# Patient Record
Sex: Female | Born: 1985 | Race: White | Hispanic: No | Marital: Single | Smoking: Never smoker
Health system: Southern US, Community
[De-identification: ages and names within clinical notes are randomized; demographics above are authoritative.]

## PROBLEM LIST (undated history)

## (undated) DIAGNOSIS — S92253A Displaced fracture of navicular [scaphoid] of unspecified foot, initial encounter for closed fracture: Secondary | ICD-10-CM

## (undated) HISTORY — PX: WISDOM TOOTH EXTRACTION: SHX21

---

## 2008-08-29 ENCOUNTER — Emergency Department (HOSPITAL_COMMUNITY): Admission: EM | Admit: 2008-08-29 | Discharge: 2008-08-29 | Payer: Self-pay | Admitting: Emergency Medicine

## 2008-08-31 ENCOUNTER — Ambulatory Visit: Payer: Self-pay | Admitting: Gynecology

## 2010-09-27 LAB — POCT I-STAT, CHEM 8
Calcium, Ion: 1.18 mmol/L (ref 1.12–1.32)
Chloride: 104 mEq/L (ref 96–112)
HCT: 41 % (ref 36.0–46.0)
Sodium: 138 mEq/L (ref 135–145)
TCO2: 24 mmol/L (ref 0–100)

## 2010-09-27 LAB — DIFFERENTIAL
Basophils Absolute: 0 10*3/uL (ref 0.0–0.1)
Basophils Relative: 0 % (ref 0–1)
Eosinophils Relative: 0 % (ref 0–5)
Monocytes Absolute: 0.5 10*3/uL (ref 0.1–1.0)
Neutro Abs: 7.9 10*3/uL — ABNORMAL HIGH (ref 1.7–7.7)

## 2010-09-27 LAB — CBC
Hemoglobin: 13.6 g/dL (ref 12.0–15.0)
MCHC: 34 g/dL (ref 30.0–36.0)
Platelets: 150 10*3/uL (ref 150–400)
RDW: 12.2 % (ref 11.5–15.5)

## 2010-09-27 LAB — HCG, QUANTITATIVE, PREGNANCY: hCG, Beta Chain, Quant, S: 1387 m[IU]/mL — ABNORMAL HIGH (ref <5)

## 2010-09-27 LAB — ABO/RH: ABO/RH(D): O POS

## 2010-09-27 LAB — WET PREP, GENITAL: Yeast Wet Prep HPF POC: NONE SEEN

## 2017-08-15 DIAGNOSIS — S92253A Displaced fracture of navicular [scaphoid] of unspecified foot, initial encounter for closed fracture: Secondary | ICD-10-CM

## 2017-08-15 HISTORY — DX: Displaced fracture of navicular (scaphoid) of unspecified foot, initial encounter for closed fracture: S92.253A

## 2017-08-27 ENCOUNTER — Other Ambulatory Visit: Payer: Self-pay | Admitting: Orthopedic Surgery

## 2017-08-27 DIAGNOSIS — S92251A Displaced fracture of navicular [scaphoid] of right foot, initial encounter for closed fracture: Secondary | ICD-10-CM

## 2017-08-28 ENCOUNTER — Ambulatory Visit
Admission: RE | Admit: 2017-08-28 | Discharge: 2017-08-28 | Disposition: A | Discharge status: Home / Self Care | Payer: BLUE CROSS/BLUE SHIELD | Source: Ambulatory Visit | Attending: Orthopedic Surgery | Admitting: Orthopedic Surgery

## 2017-08-28 DIAGNOSIS — S92251A Displaced fracture of navicular [scaphoid] of right foot, initial encounter for closed fracture: Secondary | ICD-10-CM

## 2017-08-29 ENCOUNTER — Other Ambulatory Visit: Payer: Self-pay

## 2017-09-01 ENCOUNTER — Other Ambulatory Visit: Payer: Self-pay

## 2017-09-01 ENCOUNTER — Other Ambulatory Visit: Payer: Self-pay | Admitting: Orthopedic Surgery

## 2017-09-01 ENCOUNTER — Encounter (HOSPITAL_BASED_OUTPATIENT_CLINIC_OR_DEPARTMENT_OTHER): Payer: Self-pay | Admitting: *Deleted

## 2017-09-04 ENCOUNTER — Ambulatory Visit (HOSPITAL_BASED_OUTPATIENT_CLINIC_OR_DEPARTMENT_OTHER): Payer: BLUE CROSS/BLUE SHIELD | Admitting: Anesthesiology

## 2017-09-04 ENCOUNTER — Other Ambulatory Visit: Payer: Self-pay

## 2017-09-04 ENCOUNTER — Encounter (HOSPITAL_BASED_OUTPATIENT_CLINIC_OR_DEPARTMENT_OTHER): Payer: Self-pay | Admitting: Certified Registered"

## 2017-09-04 ENCOUNTER — Encounter (HOSPITAL_BASED_OUTPATIENT_CLINIC_OR_DEPARTMENT_OTHER)
Admission: RE | Disposition: A | Discharge status: Home / Self Care | Payer: Self-pay | Source: Ambulatory Visit | Attending: Orthopedic Surgery

## 2017-09-04 ENCOUNTER — Ambulatory Visit (HOSPITAL_BASED_OUTPATIENT_CLINIC_OR_DEPARTMENT_OTHER)
Admission: RE | Admit: 2017-09-04 | Discharge: 2017-09-04 | Disposition: A | Discharge status: Home / Self Care | Payer: BLUE CROSS/BLUE SHIELD | Source: Ambulatory Visit | Attending: Orthopedic Surgery | Admitting: Orthopedic Surgery

## 2017-09-04 DIAGNOSIS — S92251A Displaced fracture of navicular [scaphoid] of right foot, initial encounter for closed fracture: Secondary | ICD-10-CM | POA: Insufficient documentation

## 2017-09-04 DIAGNOSIS — Y92410 Unspecified street and highway as the place of occurrence of the external cause: Secondary | ICD-10-CM | POA: Insufficient documentation

## 2017-09-04 HISTORY — DX: Displaced fracture of navicular (scaphoid) of unspecified foot, initial encounter for closed fracture: S92.253A

## 2017-09-04 HISTORY — PX: OPEN REDUCTION INTERNAL FIXATION (ORIF) FOOT LISFRANC FRACTURE: SHX5990

## 2017-09-04 LAB — POCT PREGNANCY, URINE: PREG TEST UR: NEGATIVE

## 2017-09-04 SURGERY — OPEN REDUCTION INTERNAL FIXATION (ORIF) FOOT LISFRANC FRACTURE
Anesthesia: General | Site: Foot | Laterality: Right

## 2017-09-04 MED ORDER — 0.9 % SODIUM CHLORIDE (POUR BTL) OPTIME
TOPICAL | Status: DC | PRN
  Administered 2017-09-04: 200 mL

## 2017-09-04 MED ORDER — BUPIVACAINE-EPINEPHRINE (PF) 0.5% -1:200000 IJ SOLN
INTRAMUSCULAR | Status: AC
  Filled 2017-09-04: qty 30

## 2017-09-04 MED ORDER — MIDAZOLAM HCL 2 MG/2ML IJ SOLN
INTRAMUSCULAR | Status: AC
Start: 2017-09-04 — End: 2017-09-04
  Filled 2017-09-04: qty 2

## 2017-09-04 MED ORDER — CEFAZOLIN SODIUM-DEXTROSE 2-4 GM/100ML-% IV SOLN
2.0000 g | INTRAVENOUS | Status: AC
  Administered 2017-09-04: 2 g via INTRAVENOUS

## 2017-09-04 MED ORDER — SCOPOLAMINE 1 MG/3DAYS TD PT72: 1.0000 | MEDICATED_PATCH | Freq: Once | TRANSDERMAL | Status: DC | PRN

## 2017-09-04 MED ORDER — PROPOFOL 10 MG/ML IV BOLUS
INTRAVENOUS | Status: DC | PRN
  Administered 2017-09-04: 200 mg via INTRAVENOUS

## 2017-09-04 MED ORDER — BUPIVACAINE HCL (PF) 0.75 % IJ SOLN
INTRAMUSCULAR | Status: DC | PRN
  Administered 2017-09-04: 25 mL

## 2017-09-04 MED ORDER — ACETAMINOPHEN 160 MG/5ML PO SOLN: 325.0000 mg | ORAL | Status: DC | PRN

## 2017-09-04 MED ORDER — LACTATED RINGERS IV SOLN
INTRAVENOUS | Status: DC
  Administered 2017-09-04 (×2): via INTRAVENOUS

## 2017-09-04 MED ORDER — BUPIVACAINE-EPINEPHRINE 0.5% -1:200000 IJ SOLN
INTRAMUSCULAR | Status: DC | PRN
  Administered 2017-09-04: 15 mL

## 2017-09-04 MED ORDER — FENTANYL CITRATE (PF) 100 MCG/2ML IJ SOLN
50.0000 ug | INTRAMUSCULAR | Status: AC | PRN
  Administered 2017-09-04 (×2): 25 ug via INTRAVENOUS
  Administered 2017-09-04: 50 ug via INTRAVENOUS
  Administered 2017-09-04: 100 ug via INTRAVENOUS

## 2017-09-04 MED ORDER — LIDOCAINE HCL (CARDIAC) 20 MG/ML IV SOLN
INTRAVENOUS | Status: AC
  Filled 2017-09-04: qty 5

## 2017-09-04 MED ORDER — DEXAMETHASONE SODIUM PHOSPHATE 10 MG/ML IJ SOLN
INTRAMUSCULAR | Status: DC | PRN
  Administered 2017-09-04: 10 mg via INTRAVENOUS

## 2017-09-04 MED ORDER — KETOROLAC TROMETHAMINE 30 MG/ML IJ SOLN: 30.0000 mg | Freq: Once | INTRAMUSCULAR | Status: DC | PRN

## 2017-09-04 MED ORDER — ACETAMINOPHEN 325 MG PO TABS: 325.0000 mg | ORAL_TABLET | ORAL | Status: DC | PRN

## 2017-09-04 MED ORDER — OXYCODONE HCL 5 MG PO TABS: 5.0000 mg | ORAL_TABLET | Freq: Once | ORAL | Status: DC | PRN

## 2017-09-04 MED ORDER — ONDANSETRON HCL 4 MG/2ML IJ SOLN
INTRAMUSCULAR | Status: AC
  Filled 2017-09-04: qty 2

## 2017-09-04 MED ORDER — OXYCODONE HCL 5 MG/5ML PO SOLN: 5.0000 mg | Freq: Once | ORAL | Status: DC | PRN

## 2017-09-04 MED ORDER — SODIUM CHLORIDE 0.9 % IV SOLN: INTRAVENOUS | Status: DC

## 2017-09-04 MED ORDER — ONDANSETRON HCL 4 MG/2ML IJ SOLN: 4.0000 mg | Freq: Once | INTRAMUSCULAR | Status: DC | PRN

## 2017-09-04 MED ORDER — MEPERIDINE HCL 25 MG/ML IJ SOLN: 6.2500 mg | INTRAMUSCULAR | Status: DC | PRN

## 2017-09-04 MED ORDER — FENTANYL CITRATE (PF) 100 MCG/2ML IJ SOLN
INTRAMUSCULAR | Status: AC
  Filled 2017-09-04: qty 2

## 2017-09-04 MED ORDER — ONDANSETRON HCL 4 MG/2ML IJ SOLN
INTRAMUSCULAR | Status: DC | PRN
  Administered 2017-09-04: 4 mg via INTRAVENOUS

## 2017-09-04 MED ORDER — MIDAZOLAM HCL 2 MG/2ML IJ SOLN
1.0000 mg | INTRAMUSCULAR | Status: DC | PRN
  Administered 2017-09-04: 2 mg via INTRAVENOUS

## 2017-09-04 MED ORDER — PROPOFOL 10 MG/ML IV BOLUS
INTRAVENOUS | Status: AC
  Filled 2017-09-04: qty 40

## 2017-09-04 MED ORDER — OXYCODONE HCL 5 MG PO TABS: 5.0000 mg | ORAL_TABLET | ORAL | 0 refills | Status: AC | PRN

## 2017-09-04 MED ORDER — CEFAZOLIN SODIUM-DEXTROSE 2-4 GM/100ML-% IV SOLN
INTRAVENOUS | Status: AC
  Filled 2017-09-04: qty 100

## 2017-09-04 MED ORDER — DEXAMETHASONE SODIUM PHOSPHATE 10 MG/ML IJ SOLN
INTRAMUSCULAR | Status: AC
  Filled 2017-09-04: qty 1

## 2017-09-04 MED ORDER — LIDOCAINE HCL (CARDIAC) 20 MG/ML IV SOLN
INTRAVENOUS | Status: DC | PRN
  Administered 2017-09-04: 30 mg via INTRAVENOUS

## 2017-09-04 MED ORDER — FENTANYL CITRATE (PF) 100 MCG/2ML IJ SOLN: 25.0000 ug | INTRAMUSCULAR | Status: DC | PRN

## 2017-09-04 MED ORDER — CHLORHEXIDINE GLUCONATE 4 % EX LIQD: 60.0000 mL | Freq: Once | CUTANEOUS | Status: DC

## 2017-09-04 SURGICAL SUPPLY — 76 items
BANDAGE ESMARK 6X9 LF (GAUZE/BANDAGES/DRESSINGS) ×1 IMPLANT
BIT DRILL 2 FAST STEP (BIT) ×2 IMPLANT
BLADE MICRO SAGITTAL (BLADE) IMPLANT
BLADE SURG 15 STRL LF DISP TIS (BLADE) ×2 IMPLANT
BLADE SURG 15 STRL SS (BLADE) ×2
BNDG COHESIVE 4X5 TAN STRL (GAUZE/BANDAGES/DRESSINGS) ×2 IMPLANT
BNDG COHESIVE 6X5 TAN STRL LF (GAUZE/BANDAGES/DRESSINGS) ×2 IMPLANT
BNDG ESMARK 6X9 LF (GAUZE/BANDAGES/DRESSINGS) ×2
CAP PIN PROTECTOR ORTHO WHT (CAP) IMPLANT
CHLORAPREP W/TINT 26ML (MISCELLANEOUS) ×2 IMPLANT
COVER BACK TABLE 60X90IN (DRAPES) ×2 IMPLANT
CUFF TOURNIQUET SINGLE 34IN LL (TOURNIQUET CUFF) IMPLANT
CUFF TOURNIQUET SINGLE 44IN (TOURNIQUET CUFF) ×2 IMPLANT
DECANTER SPIKE VIAL GLASS SM (MISCELLANEOUS) IMPLANT
DRAPE EXTREMITY T 121X128X90 (DRAPE) ×2 IMPLANT
DRAPE OEC MINIVIEW 54X84 (DRAPES) ×2 IMPLANT
DRAPE U-SHAPE 47X51 STRL (DRAPES) ×2 IMPLANT
DRIVER BIT SQUARE 1.3MM (TRAUMA) ×2 IMPLANT
DRSG MEPITEL 4X7.2 (GAUZE/BANDAGES/DRESSINGS) ×2 IMPLANT
DRSG PAD ABDOMINAL 8X10 ST (GAUZE/BANDAGES/DRESSINGS) ×2 IMPLANT
ELECT REM PT RETURN 9FT ADLT (ELECTROSURGICAL) ×2
ELECTRODE REM PT RTRN 9FT ADLT (ELECTROSURGICAL) ×1 IMPLANT
GAUZE SPONGE 4X4 12PLY STRL (GAUZE/BANDAGES/DRESSINGS) ×2 IMPLANT
GLOVE BIO SURGEON STRL SZ8 (GLOVE) ×2 IMPLANT
GLOVE BIOGEL PI IND STRL 7.0 (GLOVE) ×2 IMPLANT
GLOVE BIOGEL PI IND STRL 8 (GLOVE) ×1 IMPLANT
GLOVE BIOGEL PI INDICATOR 7.0 (GLOVE) ×2
GLOVE BIOGEL PI INDICATOR 8 (GLOVE) ×1
GLOVE ECLIPSE 6.5 STRL STRAW (GLOVE) ×2 IMPLANT
GLOVE ECLIPSE 8.0 STRL XLNG CF (GLOVE) IMPLANT
GOWN STRL REUS W/ TWL LRG LVL3 (GOWN DISPOSABLE) ×1 IMPLANT
GOWN STRL REUS W/ TWL XL LVL3 (GOWN DISPOSABLE) ×1 IMPLANT
GOWN STRL REUS W/TWL LRG LVL3 (GOWN DISPOSABLE) ×1
GOWN STRL REUS W/TWL XL LVL3 (GOWN DISPOSABLE) ×1
K-WIRE .054X4 (WIRE) IMPLANT
K-WIRE ACE 1.6X6 (WIRE) ×4
K-WIRE FIXATION 2.0X6 (WIRE)
KWIRE ACE 1.6X6 (WIRE) ×2 IMPLANT
KWIRE FIXATION 2.0X6 (WIRE) IMPLANT
NEEDLE HYPO 22GX1.5 SAFETY (NEEDLE) IMPLANT
NEEDLE HYPO 25X1 1.5 SAFETY (NEEDLE) ×2 IMPLANT
PACK BASIN DAY SURGERY FS (CUSTOM PROCEDURE TRAY) ×2 IMPLANT
PAD CAST 4YDX4 CTTN HI CHSV (CAST SUPPLIES) ×1 IMPLANT
PADDING CAST ABS 4INX4YD NS (CAST SUPPLIES)
PADDING CAST ABS COTTON 4X4 ST (CAST SUPPLIES) IMPLANT
PADDING CAST COTTON 4X4 STRL (CAST SUPPLIES) ×1
PADDING CAST COTTON 6X4 STRL (CAST SUPPLIES) ×2 IMPLANT
PEG NONLOCK 2.5X30 (Screw) ×4 IMPLANT
PENCIL BUTTON HOLSTER BLD 10FT (ELECTRODE) ×2 IMPLANT
PLATE LOCK SM RT NAVICULAR FT (Plate) ×2 IMPLANT
SANITIZER HAND PURELL 535ML FO (MISCELLANEOUS) ×2 IMPLANT
SCREW CANC NONLOCK 2.5X26 (Screw) ×2 IMPLANT
SCREW CANC NONLOCK 2.5X28 (Screw) ×2 IMPLANT
SCREW PEG 2.5X22 NONLOCK (Screw) ×2 IMPLANT
SCREW PEG LOCK 2.5X14 (Peg) ×2 IMPLANT
SCREW PEG LOCK 2.5X20 (Peg) ×2 IMPLANT
SCREW PEG LOCK 2.5X24 (Peg) ×2 IMPLANT
SHEET MEDIUM DRAPE 40X70 STRL (DRAPES) ×2 IMPLANT
SLEEVE SCD COMPRESS KNEE MED (MISCELLANEOUS) ×2 IMPLANT
SPLINT FAST PLASTER 5X30 (CAST SUPPLIES) ×20
SPLINT PLASTER CAST FAST 5X30 (CAST SUPPLIES) ×20 IMPLANT
SPONGE LAP 18X18 RF (DISPOSABLE) ×2 IMPLANT
STOCKINETTE 6  STRL (DRAPES) ×1
STOCKINETTE 6 STRL (DRAPES) ×1 IMPLANT
SUCTION FRAZIER HANDLE 10FR (MISCELLANEOUS) ×1
SUCTION TUBE FRAZIER 10FR DISP (MISCELLANEOUS) ×1 IMPLANT
SUT ETHILON 3 0 PS 1 (SUTURE) ×2 IMPLANT
SUT MNCRL AB 3-0 PS2 18 (SUTURE) ×2 IMPLANT
SUT VIC AB 0 SH 27 (SUTURE) IMPLANT
SUT VIC AB 2-0 SH 27 (SUTURE) ×1
SUT VIC AB 2-0 SH 27XBRD (SUTURE) ×1 IMPLANT
SYR BULB 3OZ (MISCELLANEOUS) ×2 IMPLANT
SYR CONTROL 10ML LL (SYRINGE) ×2 IMPLANT
TOWEL OR 17X24 6PK STRL BLUE (TOWEL DISPOSABLE) ×2 IMPLANT
TUBE CONNECTING 20X1/4 (TUBING) ×2 IMPLANT
UNDERPAD 30X30 (UNDERPADS AND DIAPERS) ×2 IMPLANT

## 2017-09-04 NOTE — Anesthesia Preprocedure Evaluation (Signed)
Anesthesia Evaluation  Patient identified by MRN, date of birth, ID band Patient awake    Reviewed: Allergy & Precautions, NPO status , Patient's Chart, lab work & pertinent test results  Airway Mallampati: II  TM Distance: >3 FB Neck ROM: Full    Dental no notable dental hx.    Pulmonary neg pulmonary ROS,    Pulmonary exam normal breath sounds clear to auscultation       Cardiovascular negative cardio ROS Normal cardiovascular exam Rhythm:Regular Rate:Normal     Neuro/Psych negative neurological ROS  negative psych ROS   GI/Hepatic negative GI ROS, Neg liver ROS,   Endo/Other  negative endocrine ROS  Renal/GU negative Renal ROS  negative genitourinary   Musculoskeletal negative musculoskeletal ROS (+)   Abdominal   Peds negative pediatric ROS (+)  Hematology negative hematology ROS (+)   Anesthesia Other Findings   Reproductive/Obstetrics negative OB ROS                             Anesthesia Physical Anesthesia Plan  ASA: II  Anesthesia Plan: General   Post-op Pain Management: GA combined w/ Regional for post-op pain   Induction:   PONV Risk Score and Plan: 3 and Ondansetron and Treatment may vary due to age or medical condition  Airway Management Planned: Oral ETT and LMA  Additional Equipment:   Intra-op Plan:   Post-operative Plan: Extubation in OR  Informed Consent: I have reviewed the patients History and Physical, chart, labs and discussed the procedure including the risks, benefits and alternatives for the proposed anesthesia with the patient or authorized representative who has indicated his/her understanding and acceptance.   Dental advisory given  Plan Discussed with: CRNA, Anesthesiologist and Surgeon  Anesthesia Plan Comments: (Discussed both nerve block for pain relief post-op and GA; including NV, sore throat, dental injury, and pulmonary complications)         Anesthesia Quick Evaluation

## 2017-09-04 NOTE — Anesthesia Procedure Notes (Addendum)
Anesthesia Regional Block: Popliteal block   Pre-Anesthetic Checklist: ,, timeout performed, Correct Patient, Correct Site, Correct Laterality, Correct Procedure, Correct Position, site marked, Risks and benefits discussed,  Surgical consent,  Pre-op evaluation,  At surgeon's request and post-op pain management  Laterality: Right  Prep: chloraprep       Needles:  Injection technique: Single-shot  Needle Type: Echogenic Stimulator Needle     Needle Length: 5cm  Needle Gauge: 22     Additional Needles:   Procedures:, nerve stimulator,,, ultrasound used (permanent image in chart),,,,   Nerve Stimulator or Paresthesia:  Response: gastroc, 0.45 mA,   Additional Responses:   Narrative:  Start time: 09/04/2017 12:24 PM End time: 09/04/2017 12:30 PM Injection made incrementally with aspirations every 5 mL.  Performed by: Personally  Anesthesiologist: Bethena Midgetddono, Ikenna Ohms, MD  Additional Notes: Functioning IV was confirmed and monitors were applied.  A 50mm 22ga Arrow echogenic stimulator needle was used. Sterile prep and drape,hand hygiene and sterile gloves were used. Ultrasound guidance: relevant anatomy identified, needle position confirmed, local anesthetic spread visualized around nerve(s)., vascular puncture avoided.  Image printed for medical record. Negative aspiration and negative test dose prior to incremental administration of local anesthetic. The patient tolerated the procedure well.
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
## 2017-09-04 NOTE — Transfer of Care (Signed)
Immediate Anesthesia Transfer of Care Note  Patient: Sonya Reese  Procedure(s) Performed: Open Reduction Internal Fixation Right Navicular Fracture (Right Foot)  Patient Location: PACU  Anesthesia Type:GA combined with regional for post-op pain  Level of Consciousness: awake and patient cooperative  Airway & Oxygen Therapy: Patient Spontanous Breathing and Patient connected to face mask oxygen  Post-op Assessment: Report given to RN and Post -op Vital signs reviewed and stable  Post vital signs: Reviewed and stable  Last Vitals:  Vitals Value Taken Time  BP 110/67 09/04/2017  2:27 PM  Temp    Pulse 77 09/04/2017  2:29 PM  Resp 15 09/04/2017  2:29 PM  SpO2 100 % 09/04/2017  2:29 PM  Vitals shown include unvalidated device data.  Last Pain:  Vitals:   09/04/17 1201  TempSrc: Oral  PainSc: 0-No pain      Patients Stated Pain Goal: 3 (09/04/17 1201)  Complications: No apparent anesthesia complications

## 2017-09-04 NOTE — Discharge Instructions (Signed)
Sonya Leaton, MD °[facility] ° °Please read the following information regarding your care after surgery. ° °Medications  °You only need a prescription for the narcotic pain medicine (ex. oxycodone, Percocet, Norco).  All of the other medicines listed below are available over the counter. °X Aleve 2 pills twice a day for the first 3 days after surgery. °X acetominophen (Tylenol) 650 mg every 4-6 hours as you need for minor to moderate pain °X oxycodone as prescribed for severe pain ° °Narcotic pain medicine (ex. oxycodone, Percocet, Vicodin) will cause constipation.  To prevent this problem, take the following medicines while you are taking any pain medicine. °X docusate sodium (Colace) 100 mg twice a day X senna (Senokot) 2 tablets twice a day ° °X To help prevent blood clots, take a baby aspirin (81 mg) twice a day for six weeks after surgery.  You should also get up every hour while you are awake to move around.   ° °Weight Bearing °? Bear weight when you are able on your operated leg or foot. °? Bear weight only on your operated foot in the post-op shoe. °X Do not bear any weight on the operated leg or foot. ° °Cast / Splint / Dressing °X Keep your splint, cast or dressing clean and dry.  Don’t put anything (coat hanger, pencil, etc) down inside of it.  If it gets damp, use a hair dryer on the cool setting to dry it.  If it gets soaked, call the office to schedule an appointment for a cast change. °? Remove your dressing 3 days after surgery and cover the incisions with dry dressings.   ° °After your dressing, cast or splint is removed; you may shower, but do not soak or scrub the wound.  Allow the water to run over it, and then gently pat it dry. ° °Swelling °It is normal for you to have swelling where you had surgery.  To reduce swelling and pain, keep your toes above your nose for at least 3 days after surgery.  It may be necessary to keep your foot or leg elevated for several weeks.  If it hurts,  it should be elevated. ° °Follow Up °Call my office at [phone] when you are discharged from the hospital or surgery center to schedule an appointment to be seen two weeks after surgery. ° °Call my office at [phone] if you develop a fever >101.5° F, nausea, vomiting, bleeding from the surgical site or severe pain.   ° °Regional Anesthesia Blocks ° °1. Numbness or the inability to move the "blocked" extremity may last from 3-48 hours after placement. The length of time depends on the medication injected and your individual response to the medication. If the numbness is not going away after 48 hours, call your surgeon. ° °2. The extremity that is blocked will need to be protected until the numbness is gone and the  Strength has returned. Because you cannot feel it, you will need to take extra care to avoid injury. Because it may be weak, you may have difficulty moving it or using it. You may not know what position it is in without looking at it while the block is in effect. ° °3. For blocks in the legs and feet, returning to weight bearing and walking needs to be done carefully. You will need to wait until the numbness is entirely gone and the strength has returned. You should be able to move your leg and foot normally before you try and   bear weight or walk. You will need someone to be with you when you first try to ensure you do not fall and possibly risk injury. ° °4. Bruising and tenderness at the needle site are common side effects and will resolve in a few days. ° °5. Persistent numbness or new problems with movement should be communicated to the surgeon or the [facility] ([phone])/ [facility] ([phone]). ° ° °Post Anesthesia Home Care Instructions ° °Activity: °Get plenty of rest for the remainder of the day. A responsible individual must stay with you for 24 hours following the procedure.  °For the next 24 hours, DO NOT: °-Drive a car °-Operate machinery °-Drink  alcoholic beverages °-Take any medication unless instructed by your physician °-Make any legal decisions or sign important papers. ° °Meals: °Start with liquid foods such as gelatin or soup. Progress to regular foods as tolerated. Avoid greasy, spicy, heavy foods. If nausea and/or vomiting occur, drink only clear liquids until the nausea and/or vomiting subsides. Call your physician if vomiting continues. ° °Special Instructions/Symptoms: °Your throat may feel dry or sore from the anesthesia or the breathing tube placed in your throat during surgery. If this causes discomfort, gargle with warm salt water. The discomfort should disappear within 24 hours. ° °If you had a scopolamine patch placed behind your ear for the management of post- operative nausea and/or vomiting: ° °1. The medication in the patch is effective for 72 hours, after which it should be removed.  Wrap patch in a tissue and discard in the trash. Wash hands thoroughly with soap and water. °2. You may remove the patch earlier than 72 hours if you experience unpleasant side effects which may include dry mouth, dizziness or visual disturbances. °3. Avoid touching the patch. Wash your hands with soap and water after contact with the patch. °  ° ° ° °

## 2017-09-04 NOTE — Progress Notes (Signed)
Assisted Dr. Oddono with right, ultrasound guided, popliteal block. Side rails up, monitors on throughout procedure. See vital signs in flow sheet. Tolerated Procedure well. 

## 2017-09-04 NOTE — Anesthesia Procedure Notes (Signed)
Procedure Name: LMA Insertion Date/Time: 09/04/2017 12:48 PM Performed by: Sheryn BisonBlocker, Jarold Macomber D, CRNA Pre-anesthesia Checklist: Patient identified, Emergency Drugs available, Suction available and Patient being monitored Patient Re-evaluated:Patient Re-evaluated prior to induction Oxygen Delivery Method: Circle system utilized Preoxygenation: Pre-oxygenation with 100% oxygen Induction Type: IV induction Ventilation: Mask ventilation without difficulty LMA: LMA inserted LMA Size: 4.0 Number of attempts: 1 Airway Equipment and Method: Bite block Placement Confirmation: positive ETCO2 Tube secured with: Tape Dental Injury: Teeth and Oropharynx as per pre-operative assessment

## 2017-09-05 NOTE — Anesthesia Postprocedure Evaluation (Signed)
Anesthesia Post Note  Patient: Sonya Reese  Procedure(s) Performed: Open Reduction Internal Fixation Right Navicular Fracture (Right Foot)     Patient location during evaluation: PACU Anesthesia Type: General Level of consciousness: awake and alert Pain management: pain level controlled Vital Signs Assessment: post-procedure vital signs reviewed and stable Respiratory status: spontaneous breathing, nonlabored ventilation, respiratory function stable and patient connected to nasal cannula oxygen Cardiovascular status: blood pressure returned to baseline and stable Postop Assessment: no apparent nausea or vomiting Anesthetic complications: no    Last Vitals:  Vitals Value Taken Time  BP    Temp    Pulse    Resp    SpO2      Last Pain:  Vitals:   09/04/17 1512  TempSrc: Oral  PainSc: 0-No pain                 Mccartney Brucks

## 2017-09-08 ENCOUNTER — Encounter (HOSPITAL_BASED_OUTPATIENT_CLINIC_OR_DEPARTMENT_OTHER): Payer: Self-pay | Admitting: Orthopedic Surgery
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
